# Patient Record
Sex: Male | Born: 1985 | Race: Black or African American | Hispanic: No | Marital: Single | Smoking: Current every day smoker
Health system: Southern US, Community
[De-identification: ages and names within clinical notes are randomized; demographics above are authoritative.]

---

## 2014-04-29 ENCOUNTER — Emergency Department (HOSPITAL_COMMUNITY)
Admission: EM | Admit: 2014-04-29 | Discharge: 2014-04-29 | Disposition: A | Discharge status: Home / Self Care | Payer: Self-pay | Attending: Emergency Medicine | Admitting: Emergency Medicine

## 2014-04-29 ENCOUNTER — Encounter (HOSPITAL_COMMUNITY): Payer: Self-pay | Admitting: Emergency Medicine

## 2014-04-29 ENCOUNTER — Emergency Department (HOSPITAL_COMMUNITY): Payer: Self-pay

## 2014-04-29 DIAGNOSIS — S0990XA Unspecified injury of head, initial encounter: Secondary | ICD-10-CM

## 2014-04-29 DIAGNOSIS — R4020 Unspecified coma: Secondary | ICD-10-CM

## 2014-04-29 DIAGNOSIS — Y9389 Activity, other specified: Secondary | ICD-10-CM | POA: Insufficient documentation

## 2014-04-29 DIAGNOSIS — Y92241 Library as the place of occurrence of the external cause: Secondary | ICD-10-CM | POA: Insufficient documentation

## 2014-04-29 DIAGNOSIS — S060X9A Concussion with loss of consciousness of unspecified duration, initial encounter: Secondary | ICD-10-CM | POA: Insufficient documentation

## 2014-04-29 DIAGNOSIS — Z720 Tobacco use: Secondary | ICD-10-CM | POA: Insufficient documentation

## 2014-04-29 DIAGNOSIS — R519 Headache, unspecified: Secondary | ICD-10-CM

## 2014-04-29 DIAGNOSIS — Y998 Other external cause status: Secondary | ICD-10-CM | POA: Insufficient documentation

## 2014-04-29 DIAGNOSIS — S01511A Laceration without foreign body of lip, initial encounter: Secondary | ICD-10-CM | POA: Insufficient documentation

## 2014-04-29 DIAGNOSIS — R51 Headache: Secondary | ICD-10-CM

## 2014-04-29 MED ORDER — HYDROCODONE-ACETAMINOPHEN 5-325 MG PO TABS: 1.0000 | ORAL_TABLET | Freq: Four times a day (QID) | ORAL | Status: AC | PRN

## 2014-04-29 MED ORDER — HYDROCODONE-ACETAMINOPHEN 5-325 MG PO TABS
2.0000 | ORAL_TABLET | Freq: Once | ORAL | Status: AC
  Administered 2014-04-29: 2 via ORAL
  Filled 2014-04-29: qty 2

## 2014-04-29 NOTE — ED Notes (Signed)
[facility] Expected date:  Expected time:  Means of arrival:  Comments: [facility]
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
## 2014-04-29 NOTE — Discharge Instructions (Signed)
1. Medications: vicodin, usual home medications 2. Treatment: rest, drink plenty of fluids,  3. Follow Up: Please followup with your primary doctor in 7 days for discussion of your diagnoses and further evaluation after today's visit; if you do not have a primary care doctor use the resource guide provided to find one; Please return to the ER for worsening headache, vision changes, confusion, seizures  Concussion A concussion, or closed-head injury, is a brain injury caused by a direct blow to the head or by a quick and sudden movement (jolt) of the head or neck. Concussions are usually not life-threatening. Even so, the effects of a concussion can be serious. If you have had a concussion before, you are more likely to experience concussion-like symptoms after a direct blow to the head.  CAUSES  Direct blow to the head, such as from running into another player during a soccer game, being hit in a fight, or hitting your head on a hard surface.  A jolt of the head or neck that causes the brain to move back and forth inside the skull, such as in a car crash. SIGNS AND SYMPTOMS The signs of a concussion can be hard to notice. Early on, they may be missed by you, family members, and health care providers. You may look fine but act or feel differently. Symptoms are usually temporary, but they may last for days, weeks, or even longer. Some symptoms may appear right away while others may not show up for hours or days. Every head injury is different. Symptoms include:  Mild to moderate headaches that will not go away.  A feeling of pressure inside your head.  Having more trouble than usual:  Learning or remembering things you have heard.  Answering questions.  Paying attention or concentrating.  Organizing daily tasks.  Making decisions and solving problems.  Slowness in thinking, acting or reacting, speaking, or reading.  Getting lost or being easily confused.  Feeling tired all the time  or lacking energy (fatigued).  Feeling drowsy.  Sleep disturbances.  Sleeping more than usual.  Sleeping less than usual.  Trouble falling asleep.  Trouble sleeping (insomnia).  Loss of balance or feeling lightheaded or dizzy.  Nausea or vomiting.  Numbness or tingling.  Increased sensitivity to:  Sounds.  Lights.  Distractions.  Vision problems or eyes that tire easily.  Diminished sense of taste or smell.  Ringing in the ears.  Mood changes such as feeling sad or anxious.  Becoming easily irritated or angry for little or no reason.  Lack of motivation.  Seeing or hearing things other people do not see or hear (hallucinations). DIAGNOSIS Your health care provider can usually diagnose a concussion based on a description of your injury and symptoms. He or she will ask whether you passed out (lost consciousness) and whether you are having trouble remembering events that happened right before and during your injury. Your evaluation might include:  A brain scan to look for signs of injury to the brain. Even if the test shows no injury, you may still have a concussion.  Blood tests to be sure other problems are not present. TREATMENT  Concussions are usually treated in an emergency department, in urgent care, or at a clinic. You may need to stay in the hospital overnight for further treatment.  Tell your health care provider if you are taking any medicines, including prescription medicines, over-the-counter medicines, and natural remedies. Some medicines, such as blood thinners (anticoagulants) and aspirin, may increase the  chance of complications. Also tell your health care provider whether you have had alcohol or are taking illegal drugs. This information may affect treatment.  Your health care provider will send you home with important instructions to follow.  How fast you will recover from a concussion depends on many factors. These factors include how severe  your concussion is, what part of your brain was injured, your age, and how healthy you were before the concussion.  Most people with mild injuries recover fully. Recovery can take time. In general, recovery is slower in older persons. Also, persons who have had a concussion in the past or have other medical problems may find that it takes longer to recover from their current injury. HOME CARE INSTRUCTIONS General Instructions  Carefully follow the directions your health care provider gave you.  Only take over-the-counter or prescription medicines for pain, discomfort, or fever as directed by your health care provider.  Take only those medicines that your health care provider has approved.  Do not drink alcohol until your health care provider says you are well enough to do so. Alcohol and certain other drugs may slow your recovery and can put you at risk of further injury.  If it is harder than usual to remember things, write them down.  If you are easily distracted, try to do one thing at a time. For example, do not try to watch TV while fixing dinner.  Talk with family members or close friends when making important decisions.  Keep all follow-up appointments. Repeated evaluation of your symptoms is recommended for your recovery.  Watch your symptoms and tell others to do the same. Complications sometimes occur after a concussion. Older adults with a brain injury may have a higher risk of serious complications, such as a blood clot on the brain.  Tell your teachers, school nurse, school counselor, coach, athletic trainer, or work Production designer, theatre/television/film about your injury, symptoms, and restrictions. Tell them about what you can or cannot do. They should watch for:  Increased problems with attention or concentration.  Increased difficulty remembering or learning new information.  Increased time needed to complete tasks or assignments.  Increased irritability or decreased ability to cope with  stress.  Increased symptoms.  Rest. Rest helps the brain to heal. Make sure you:  Get plenty of sleep at night. Avoid staying up late at night.  Keep the same bedtime hours on weekends and weekdays.  Rest during the day. Take daytime naps or rest breaks when you feel tired.  Limit activities that require a lot of thought or concentration. These include:  Doing homework or job-related work.  Watching TV.  Working on the computer.  Avoid any situation where there is potential for another head injury (football, hockey, soccer, basketball, martial arts, downhill snow sports and horseback riding). Your condition will get worse every time you experience a concussion. You should avoid these activities until you are evaluated by the appropriate follow-up health care providers. Returning To Your Regular Activities You will need to return to your normal activities slowly, not all at once. You must give your body and brain enough time for recovery.  Do not return to sports or other athletic activities until your health care provider tells you it is safe to do so.  Ask your health care provider when you can drive, ride a bicycle, or operate heavy machinery. Your ability to react may be slower after a brain injury. Never do these activities if you are dizzy.  Ask  your health care provider about when you can return to work or school. Preventing Another Concussion It is very important to avoid another brain injury, especially before you have recovered. In rare cases, another injury can lead to permanent brain damage, brain swelling, or death. The risk of this is greatest during the first 7-10 days after a head injury. Avoid injuries by:  Wearing a seat belt when riding in a car.  Drinking alcohol only in moderation.  Wearing a helmet when biking, skiing, skateboarding, skating, or doing similar activities.  Avoiding activities that could lead to a second concussion, such as contact or  recreational sports, until your health care provider says it is okay.  Taking safety measures in your home.  Remove clutter and tripping hazards from floors and stairways.  Use grab bars in bathrooms and handrails by stairs.  Place non-slip mats on floors and in bathtubs.  Improve lighting in dim areas. SEEK MEDICAL CARE IF:  You have increased problems paying attention or concentrating.  You have increased difficulty remembering or learning new information.  You need more time to complete tasks or assignments than before.  You have increased irritability or decreased ability to cope with stress.  You have more symptoms than before. Seek medical care if you have any of the following symptoms for more than 2 weeks after your injury:  Lasting (chronic) headaches.  Dizziness or balance problems.  Nausea.  Vision problems.  Increased sensitivity to noise or light.  Depression or mood swings.  Anxiety or irritability.  Memory problems.  Difficulty concentrating or paying attention.  Sleep problems.  Feeling tired all the time. SEEK IMMEDIATE MEDICAL CARE IF:  You have severe or worsening headaches. These may be a sign of a blood clot in the brain.  You have weakness (even if only in one hand, leg, or part of the face).  You have numbness.  You have decreased coordination.  You vomit repeatedly.  You have increased sleepiness.  One pupil is larger than the other.  You have convulsions.  You have slurred speech.  You have increased confusion. This may be a sign of a blood clot in the brain.  You have increased restlessness, agitation, or irritability.  You are unable to recognize people or places.  You have neck pain.  It is difficult to wake you up.  You have unusual behavior changes.  You lose consciousness. MAKE SURE YOU:  Understand these instructions.  Will watch your condition.  Will get help right away if you are not doing well or  get worse. Document Released: 06/29/2003 Document Revised: 04/13/2013 Document Reviewed: 10/29/2012 [facility] Patient Information 2015 [place], [place]. This information is not intended to replace advice given to you by your health care provider. Make sure you discuss any questions you have with your health care provider.

## 2014-04-29 NOTE — ED Notes (Signed)
Patient transported to CT 
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
